# Patient Record
Sex: Female | Born: 1995 | Race: Black or African American | Hispanic: No | Marital: Single | Smoking: Current some day smoker
Health system: Southern US, Community
[De-identification: ages and names within clinical notes are randomized; demographics above are authoritative.]

## PROBLEM LIST (undated history)

## (undated) DIAGNOSIS — J020 Streptococcal pharyngitis: Secondary | ICD-10-CM

---

## 2016-07-29 ENCOUNTER — Emergency Department (HOSPITAL_COMMUNITY): Payer: Medicaid - Out of State

## 2016-07-29 ENCOUNTER — Encounter (HOSPITAL_COMMUNITY): Payer: Self-pay | Admitting: Emergency Medicine

## 2016-07-29 ENCOUNTER — Emergency Department (HOSPITAL_COMMUNITY)
Admission: EM | Admit: 2016-07-29 | Discharge: 2016-07-29 | Disposition: A | Discharge status: Home / Self Care | Payer: Medicaid - Out of State | Attending: Physician Assistant | Admitting: Physician Assistant

## 2016-07-29 DIAGNOSIS — F17200 Nicotine dependence, unspecified, uncomplicated: Secondary | ICD-10-CM | POA: Insufficient documentation

## 2016-07-29 DIAGNOSIS — J029 Acute pharyngitis, unspecified: Secondary | ICD-10-CM | POA: Insufficient documentation

## 2016-07-29 DIAGNOSIS — R05 Cough: Secondary | ICD-10-CM

## 2016-07-29 DIAGNOSIS — R059 Cough, unspecified: Secondary | ICD-10-CM

## 2016-07-29 HISTORY — DX: Streptococcal pharyngitis: J02.0

## 2016-07-29 LAB — RAPID STREP SCREEN (MED CTR MEBANE ONLY): STREPTOCOCCUS, GROUP A SCREEN (DIRECT): NEGATIVE

## 2016-07-29 NOTE — Discharge Instructions (Signed)
Your chest x-ray and strep test were both negative. As we discussed, this is likely viral. Recommend rest and drink fluids.  May use over the counter cough medication and chloraseptic to help with your throat pain. Return here for new concerns.

## 2016-07-29 NOTE — ED Triage Notes (Signed)
Pt st's she has had a sore throat and cough x's 1 week.

## 2016-07-29 NOTE — ED Provider Notes (Signed)
[facility] Provider Note   CSN: [id number] Arrival date & time: 07/29/16  1911  By signing my name below, I, Aggie Moats, attest that this documentation has been prepared under the direction and in the presence of Sharilyn Sites, PA-C. Electronically signed by: Aggie Moats, ED Scribe. 07/29/16. 8:00 PM.  History   Chief Complaint Chief Complaint  Patient presents with  . Sore Throat   The history is provided by the patient. No language interpreter was used.   HPI Comments:  Olivia Mills is a 20 y.o. female who presents to the Emergency Department complaining of cough, which started 1 week ago. Pt reports that coughing ceased a couple days ago, but resumed. Associated symptoms include sore throat which started today and low grade fever. No modifying factors reported. Denies chills or other associated symptoms. Pt requests a chest x-ray.   Past Medical History:  Diagnosis Date  . Strep throat     There are no active problems to display for this patient.   History reviewed. No pertinent surgical history.  OB History    No data available       Home Medications    Prior to Admission medications   Not on File    Family History No family history on file.  Social History Social History  Substance Use Topics  . Smoking status: Current Some Day Smoker  . Smokeless tobacco: Never Used  . Alcohol use Yes     Allergies   Review of patient's allergies indicates not on file.   Review of Systems Review of Systems  Constitutional: Positive for fever. Negative for chills.  HENT: Positive for sore throat.   Respiratory: Positive for cough.   All other systems reviewed and are negative.    Physical Exam Updated Vital Signs BP 122/83 (BP Location: Left Arm)   Pulse 94   Temp 98.9 F (37.2 C) (Oral)   Resp 20   Ht 5\' 4"  (1.626 m)   Wt 135 lb 1 oz (61.3 kg)   LMP 07/15/2016   SpO2 100%   BMI 23.18 kg/m   Physical Exam  Constitutional: She is oriented to  person, place, and time. She appears well-developed and well-nourished.  HENT:  Head: Normocephalic and atraumatic.  Right Ear: Tympanic membrane normal.  Left Ear: Tympanic membrane normal.  Nose: Nose normal.  Mouth/Throat: Uvula is midline and mucous membranes are normal. Posterior oropharyngeal erythema present.  Mild oropharyngeal erythema; tonsils overall normal in appearance bilaterally without exudate; uvula midline without evidence of peritonsillar abscess; handling secretions appropriately; no difficulty swallowing or speaking; normal phonation without stridor  Eyes: Conjunctivae and EOM are normal. Pupils are equal, round, and reactive to light.  Neck: Normal range of motion.  Cardiovascular: Normal rate, regular rhythm and normal heart sounds.   Pulmonary/Chest: Effort normal and breath sounds normal. She has no decreased breath sounds. She has no wheezes. She has no rhonchi.  Abdominal: Soft. Bowel sounds are normal.  Musculoskeletal: Normal range of motion.  Neurological: She is alert and oriented to person, place, and time.  Skin: Skin is warm and dry.  Psychiatric: She has a normal mood and affect.  Nursing note and vitals reviewed.    ED Treatments / Results  DIAGNOSTIC STUDIES:  Oxygen Saturation is 100% on room air, normal by my interpretation.    COORDINATION OF CARE:  7:57 PM Discussed treatment plan with pt at bedside, which includes chest x-ray and rapid strep screen, and pt agreed to plan.  Labs (  all labs ordered are listed, but only abnormal results are displayed) Labs Reviewed  RAPID STREP SCREEN (NOT AT [facility])  CULTURE, GROUP A STREP [facility])    EKG  EKG Interpretation None       Radiology Dg Chest 2 View  Result Date: 07/29/2016 CLINICAL DATA:  20 year old female with cough and shortness of breath. Left-sided chest pain. EXAM: CHEST  2 VIEW COMPARISON:  None. FINDINGS: The heart size and mediastinal contours are within normal limits. Both lungs  are clear. The visualized skeletal structures are unremarkable. IMPRESSION: No active cardiopulmonary disease. Electronically Signed   By: Elgie CollardArash  Radparvar M.D.   On: 07/29/2016 20:40    Procedures Procedures (including critical care time)  Medications Ordered in ED Medications - No data to display   Initial Impression / Assessment and Plan / ED Course  I have reviewed the triage vital signs and the nursing notes.  Pertinent labs & imaging results that were available during my care of the patient were reviewed by me and considered in my medical decision making (see chart for details).  Clinical Course   20 year old female here with cough and sore throat which began on Monday of this week. Patient is afebrile and nontoxic. She has mild erythema of her oropharynx without any tonsillar edema or exudates. Her lungs are clear without wheezes or rhonchi. Patient overall appears well. Feel this is likely a viral process, however she is adamant about further testing.  CXR and rapid strep are both negative.  Continue to feel this is likely viral.  Discharge home with supportive care.  Discussed plan with patient, she acknowledged understanding and agreed with plan of care.  Return precautions given for new or worsening symptoms.  Final Clinical Impressions(s) / ED Diagnoses   Final diagnoses:  Sore throat  Cough    New Prescriptions There are no discharge medications for this patient.  I personally performed the services described in this documentation, which was scribed in my presence. The recorded information has been reviewed and is accurate.    Garlon HatchetLisa M Sanders, PA-C 07/29/16 2138    Courteney Randall AnLyn Mackuen, MD 07/29/16 2328

## 2016-07-31 LAB — CULTURE, GROUP A STREP (THRC)

## 2016-09-28 ENCOUNTER — Encounter (HOSPITAL_COMMUNITY): Payer: Self-pay | Admitting: Emergency Medicine

## 2016-09-28 ENCOUNTER — Emergency Department (HOSPITAL_COMMUNITY)
Admission: RE | Admit: 2016-09-28 | Discharge: 2016-09-28 | Disposition: A | Discharge status: Home / Self Care | Payer: Medicaid - Out of State | Attending: Emergency Medicine | Admitting: Emergency Medicine

## 2016-09-28 DIAGNOSIS — B9689 Other specified bacterial agents as the cause of diseases classified elsewhere: Secondary | ICD-10-CM

## 2016-09-28 DIAGNOSIS — N760 Acute vaginitis: Secondary | ICD-10-CM | POA: Insufficient documentation

## 2016-09-28 DIAGNOSIS — F17200 Nicotine dependence, unspecified, uncomplicated: Secondary | ICD-10-CM | POA: Insufficient documentation

## 2016-09-28 DIAGNOSIS — N730 Acute parametritis and pelvic cellulitis: Secondary | ICD-10-CM | POA: Insufficient documentation

## 2016-09-28 LAB — URINALYSIS, ROUTINE W REFLEX MICROSCOPIC
Bilirubin Urine: NEGATIVE
Glucose, UA: NEGATIVE mg/dL
Hgb urine dipstick: NEGATIVE
Ketones, ur: NEGATIVE mg/dL
LEUKOCYTES UA: NEGATIVE
NITRITE: NEGATIVE
PROTEIN: NEGATIVE mg/dL
Specific Gravity, Urine: 1.011 (ref 1.005–1.030)
pH: 7 (ref 5.0–8.0)

## 2016-09-28 LAB — WET PREP, GENITAL
Sperm: NONE SEEN
TRICH WET PREP: NONE SEEN
YEAST WET PREP: NONE SEEN

## 2016-09-28 LAB — POC URINE PREG, ED: PREG TEST UR: NEGATIVE

## 2016-09-28 MED ORDER — METRONIDAZOLE 500 MG PO TABS: 500.0000 mg | ORAL_TABLET | Freq: Two times a day (BID) | ORAL | 0 refills | Status: DC

## 2016-09-28 MED ORDER — DOXYCYCLINE HYCLATE 100 MG PO CAPS: 100.0000 mg | ORAL_CAPSULE | Freq: Two times a day (BID) | ORAL | 0 refills | Status: AC

## 2016-09-28 MED ORDER — NAPROXEN 375 MG PO TABS: 375.0000 mg | ORAL_TABLET | Freq: Two times a day (BID) | ORAL | 0 refills | Status: AC | PRN

## 2016-09-28 MED ORDER — CEFTRIAXONE SODIUM 250 MG IJ SOLR
250.0000 mg | Freq: Once | INTRAMUSCULAR | Status: AC
  Administered 2016-09-28: 250 mg via INTRAMUSCULAR
  Filled 2016-09-28: qty 250

## 2016-09-28 NOTE — ED Triage Notes (Signed)
Pt states she has been having vaginal itching for 2 days. Denies any discharge. Denies any burning/frequency with urination.

## 2016-09-28 NOTE — ED Provider Notes (Signed)
[facility] Provider Note   CSN: [id number] Arrival date & time: 09/28/16  1331     History   Chief Complaint Chief Complaint  Patient presents with  . Vaginal Itching    HPI Olivia Mills is a 20 y.o. female.  HPI 20 year old female who presents with vaginal discharge and odor. The patient states that she recently started to become regularly sexually active. She has sex with males only and has used condoms for protection. She states that over the last several days, she has noticed moderate vaginal pain and burning sensation. She is also noticed increased pain with intercourse. She has had vaginal odor but only minimal white discharge over the same time period. No fevers or chills. No nausea or vomiting. She denies known exposures to STDs and states she has had protection every time that she has had intercourse. She has never had an OB visit. No history of Pap smears. No other medical problems.  Past Medical History:  Diagnosis Date  . Strep throat     There are no active problems to display for this patient.   History reviewed. No pertinent surgical history.  OB History    No data available       Home Medications    Prior to Admission medications   Medication Sig Start Date End Date Taking? Authorizing Provider  ibuprofen (ADVIL,MOTRIN) 200 MG tablet Take 200 mg by mouth every 6 (six) hours as needed for moderate pain.   Yes Historical Provider, MD  doxycycline (VIBRAMYCIN) 100 MG capsule Take 1 capsule (100 mg total) by mouth 2 (two) times daily. 09/28/16 10/12/16  Shaune Pollackameron Avrum Kimball, MD  metroNIDAZOLE (FLAGYL) 500 MG tablet Take 1 tablet (500 mg total) by mouth 2 (two) times daily. 09/28/16   Shaune Pollackameron Jermaine Tholl, MD  naproxen (NAPROSYN) 375 MG tablet Take 1 tablet (375 mg total) by mouth 2 (two) times daily as needed for moderate pain. 09/28/16 10/05/16  Shaune Pollackameron Kree Armato, MD    Family History History reviewed. No pertinent family history.  Social History Social History    Substance Use Topics  . Smoking status: Current Some Day Smoker  . Smokeless tobacco: Never Used  . Alcohol use Yes     Allergies   Patient has no known allergies.   Review of Systems Review of Systems  Constitutional: Negative for chills and fever.  Respiratory: Negative for shortness of breath.   Cardiovascular: Negative for chest pain.  Genitourinary: Positive for pelvic pain, vaginal discharge and vaginal pain.  Musculoskeletal: Negative for neck pain.  Skin: Negative for rash and wound.  Allergic/Immunologic: Negative for immunocompromised state.  Neurological: Negative for weakness and numbness.  Hematological: Does not bruise/bleed easily.     Physical Exam Updated Vital Signs BP 129/81 (BP Location: Right Arm)   Pulse 71   Temp 98.9 F (37.2 C) (Oral)   Resp 16   Ht 5\' 5"  (1.651 m)   Wt 162 lb (73.5 kg)   LMP 09/12/2016   SpO2 100%   BMI 26.96 kg/m   Physical Exam  Constitutional: She is oriented to person, place, and time. She appears well-developed and well-nourished. No distress.  HENT:  Head: Normocephalic and atraumatic.  Eyes: Conjunctivae are normal.  Neck: Neck supple.  Cardiovascular: Normal rate, regular rhythm and normal heart sounds.  Exam reveals no friction rub.   No murmur heard. Pulmonary/Chest: Effort normal and breath sounds normal. No respiratory distress. She has no wheezes. She has no rales.  Abdominal: Soft. Bowel sounds are normal.  She exhibits no distension. There is no tenderness.  Musculoskeletal: She exhibits no edema.  Neurological: She is alert and oriented to person, place, and time. She exhibits normal muscle tone.  Skin: Skin is warm. Capillary refill takes less than 2 seconds.  Psychiatric: She has a normal mood and affect.  Nursing note and vitals reviewed.    ED Treatments / Results  Labs (all labs ordered are listed, but only abnormal results are displayed) Labs Reviewed  WET PREP, GENITAL - Abnormal; Notable  for the following:       Result Value   Clue Cells Wet Prep HPF POC PRESENT (*)    WBC, Wet Prep HPF POC MANY (*)    All other components within normal limits  URINALYSIS, ROUTINE W REFLEX MICROSCOPIC (NOT AT [facility]) - Abnormal; Notable for the following:    APPearance CLOUDY (*)    All other components within normal limits  POC URINE PREG, ED  GC/CHLAMYDIA PROBE AMP ([place]) NOT AT [facility]    EKG  EKG Interpretation None       Radiology No results found.  Procedures Procedures (including critical care time)  Medications Ordered in ED Medications  cefTRIAXone (ROCEPHIN) injection 250 mg (250 mg Intramuscular Given 09/28/16 1629)     Initial Impression / Assessment and Plan / ED Course  I have reviewed the triage vital signs and the nursing notes.  Pertinent labs & imaging results that were available during my care of the patient were reviewed by me and considered in my medical decision making (see chart for details).  Clinical Course     20 year old female with no significant past medical history presents with vaginal discharge and pain. On exam, patient has friable, tender cervix with cervical motion tenderness. No adnexal tenderness. Patient is otherwise well-appearing, afebrile, and without signs of systemic infection. No RUQ TTP or signs of FHC. UPT is negative. Urinalysis shows no evidence of infection. Wet prep shows positive white blood cells and clue cells but no Trichomonas or yeast. Will treat empirically for PID as well as bacterial vaginosis. I discussed possible implications with the patient and she will follow up with student health. GC/Chlamydia sent and is pending.   Final Clinical Impressions(s) / ED Diagnoses   Final diagnoses:  BV (bacterial vaginosis)  PID (acute pelvic inflammatory disease)    New Prescriptions New Prescriptions   DOXYCYCLINE (VIBRAMYCIN) 100 MG CAPSULE    Take 1 capsule (100 mg total) by mouth 2 (two) times daily.    METRONIDAZOLE (FLAGYL) 500 MG TABLET    Take 1 tablet (500 mg total) by mouth 2 (two) times daily.   NAPROXEN (NAPROSYN) 375 MG TABLET    Take 1 tablet (375 mg total) by mouth 2 (two) times daily as needed for moderate pain.     Shaune Pollackameron Indica Marcott, MD 09/28/16 [phone]

## 2016-09-28 NOTE — ED Notes (Signed)
Pelvic cart at bedside. 

## 2016-09-29 LAB — GC/CHLAMYDIA PROBE AMP (~~LOC~~) NOT AT ARMC
CHLAMYDIA, DNA PROBE: NEGATIVE
NEISSERIA GONORRHEA: NEGATIVE

## 2016-10-05 ENCOUNTER — Emergency Department (HOSPITAL_COMMUNITY)
Admission: EM | Admit: 2016-10-05 | Discharge: 2016-10-05 | Disposition: A | Discharge status: Home / Self Care | Payer: Medicaid - Out of State | Attending: Emergency Medicine | Admitting: Emergency Medicine

## 2016-10-05 ENCOUNTER — Encounter (HOSPITAL_COMMUNITY): Payer: Self-pay | Admitting: *Deleted

## 2016-10-05 DIAGNOSIS — N898 Other specified noninflammatory disorders of vagina: Secondary | ICD-10-CM | POA: Diagnosis not present

## 2016-10-05 DIAGNOSIS — F17200 Nicotine dependence, unspecified, uncomplicated: Secondary | ICD-10-CM | POA: Diagnosis not present

## 2016-10-05 LAB — URINALYSIS, ROUTINE W REFLEX MICROSCOPIC
BILIRUBIN URINE: NEGATIVE
Glucose, UA: NEGATIVE mg/dL
Ketones, ur: NEGATIVE mg/dL
Nitrite: NEGATIVE
PH: 5.5 (ref 5.0–8.0)
Protein, ur: NEGATIVE mg/dL
SPECIFIC GRAVITY, URINE: 1.01 (ref 1.005–1.030)

## 2016-10-05 LAB — URINE MICROSCOPIC-ADD ON

## 2016-10-05 LAB — GC/CHLAMYDIA PROBE AMP (~~LOC~~) NOT AT ARMC
CHLAMYDIA, DNA PROBE: NEGATIVE
NEISSERIA GONORRHEA: NEGATIVE

## 2016-10-05 LAB — WET PREP, GENITAL
CLUE CELLS WET PREP: NONE SEEN
Sperm: NONE SEEN
TRICH WET PREP: NONE SEEN
Yeast Wet Prep HPF POC: NONE SEEN

## 2016-10-05 MED ORDER — FLUCONAZOLE 100 MG PO TABS
150.0000 mg | ORAL_TABLET | Freq: Once | ORAL | Status: AC
  Administered 2016-10-05: 150 mg via ORAL
  Filled 2016-10-05: qty 2

## 2016-10-05 NOTE — ED Triage Notes (Signed)
Pt reports being seen on 11/8 and diagnosed with BV. Has been taking antibiotics but reports increase in vaginal discharge, burning and itching.

## 2016-10-05 NOTE — ED Provider Notes (Signed)
[facility] Provider Note   CSN: [id number] Arrival date & time: 10/05/16  1145     History   Chief Complaint Chief Complaint  Patient presents with  . Vaginal Discharge    HPI Olivia Mills is a 20 y.o. female.  The history is provided by the patient.  Vaginal Discharge   This is a new problem. The current episode started more than 2 days ago. The problem occurs constantly. The problem has not changed since onset.The discharge occurs while at rest. The discharge was brown. She is not pregnant. Missed Period: currently on her period. Pertinent negatives include no nausea and no vomiting. Treatments tried: rocephin/doxy/flagyl. The treatment provided no relief. Her past medical history does not include STD.    Past Medical History:  Diagnosis Date  . Strep throat     There are no active problems to display for this patient.   History reviewed. No pertinent surgical history.  OB History    No data available       Home Medications    Prior to Admission medications   Medication Sig Start Date End Date Taking? Authorizing Provider  doxycycline (VIBRAMYCIN) 100 MG capsule Take 1 capsule (100 mg total) by mouth 2 (two) times daily. 09/28/16 10/12/16  Shaune Pollackameron Isaacs, MD  ibuprofen (ADVIL,MOTRIN) 200 MG tablet Take 200 mg by mouth every 6 (six) hours as needed for moderate pain.    Historical Provider, MD  metroNIDAZOLE (FLAGYL) 500 MG tablet Take 1 tablet (500 mg total) by mouth 2 (two) times daily. 09/28/16   Shaune Pollackameron Isaacs, MD  naproxen (NAPROSYN) 375 MG tablet Take 1 tablet (375 mg total) by mouth 2 (two) times daily as needed for moderate pain. 09/28/16 10/05/16  Shaune Pollackameron Isaacs, MD    Family History History reviewed. No pertinent family history.  Social History Social History  Substance Use Topics  . Smoking status: Current Some Day Smoker  . Smokeless tobacco: Never Used  . Alcohol use Yes     Allergies   Patient has no known allergies.   Review of  Systems Review of Systems  Gastrointestinal: Negative for nausea and vomiting.  Genitourinary: Positive for vaginal discharge.  All other systems reviewed and are negative.    Physical Exam Updated Vital Signs BP 134/86   Pulse 84   Temp 98.2 F (36.8 C) (Oral)   Resp 17   LMP 09/12/2016   SpO2 100%   Physical Exam  Constitutional: She is oriented to person, place, and time. She appears well-developed and well-nourished. No distress.  HENT:  Head: Normocephalic.  Nose: Nose normal.  Eyes: Conjunctivae are normal.  Neck: Neck supple. No tracheal deviation present.  Cardiovascular: Normal rate and regular rhythm.   Pulmonary/Chest: Effort normal. No respiratory distress.  Abdominal: Soft. She exhibits no distension.  Genitourinary: Uterus normal. Pelvic exam was performed with patient supine. There is no rash on the right labia. There is no rash on the left labia. Cervix exhibits no motion tenderness and no discharge. Right adnexum displays no tenderness and no fullness. Left adnexum displays no tenderness and no fullness. There is bleeding in the vagina.  Neurological: She is alert and oriented to person, place, and time.  Skin: Skin is warm and dry.  Psychiatric: She has a normal mood and affect.  Vitals reviewed.    ED Treatments / Results  Labs (all labs ordered are listed, but only abnormal results are displayed) Labs Reviewed  WET PREP, GENITAL - Abnormal; Notable for the following:  Result Value   WBC, Wet Prep HPF POC MODERATE (*)    All other components within normal limits  URINALYSIS, ROUTINE W REFLEX MICROSCOPIC (NOT AT [facility]) - Abnormal; Notable for the following:    Color, Urine AMBER (*)    APPearance HAZY (*)    Hgb urine dipstick LARGE (*)    Leukocytes, UA SMALL (*)    All other components within normal limits  URINE MICROSCOPIC-ADD ON - Abnormal; Notable for the following:    Squamous Epithelial / LPF 6-30 (*)    Bacteria, UA FEW (*)    All  other components within normal limits  GC/CHLAMYDIA PROBE AMP ([place]) NOT AT [facility]    EKG  EKG Interpretation None       Radiology No results found.  Procedures Procedures (including critical care time)  Medications Ordered in ED Medications - No data to display   Initial Impression / Assessment and Plan / ED Course  I have reviewed the triage vital signs and the nursing notes.  Pertinent labs & imaging results that were available during my care of the patient were reviewed by me and considered in my medical decision making (see chart for details).  Clinical Course     20 y.o. female presents with Vaginal irritation and itching that started 1 week ago. She was seen here and diagnosed with bacterial vaginosis and has been on a course of Flagyl and doxycycline to cover for BV and PID. Her GC chlamydia testing was negative at that time. She is currently on her cycle and feels that the itching has not improved. She was not covered for yeast on the previous visit so she will be given 150 mg of fluconazole for empiric coverage. I recommended that she continue her antibiotics and follow up with OB/GYN on an outpatient basis for further complaints as she does not appear to have an emergent medical condition currently.  Final Clinical Impressions(s) / ED Diagnoses   Final diagnoses:  Vaginal itching    New Prescriptions New Prescriptions   No medications on file     Lyndal Pulleyaniel Daiana Vitiello, MD 10/05/16 2219

## 2017-03-10 ENCOUNTER — Encounter (HOSPITAL_COMMUNITY): Payer: Self-pay | Admitting: *Deleted

## 2017-03-10 ENCOUNTER — Emergency Department (HOSPITAL_COMMUNITY)
Admission: EM | Admit: 2017-03-10 | Discharge: 2017-03-10 | Disposition: A | Discharge status: Home / Self Care | Payer: Medicaid - Out of State | Attending: Emergency Medicine | Admitting: Emergency Medicine

## 2017-03-10 DIAGNOSIS — N760 Acute vaginitis: Secondary | ICD-10-CM | POA: Insufficient documentation

## 2017-03-10 DIAGNOSIS — F17200 Nicotine dependence, unspecified, uncomplicated: Secondary | ICD-10-CM | POA: Insufficient documentation

## 2017-03-10 DIAGNOSIS — B9689 Other specified bacterial agents as the cause of diseases classified elsewhere: Secondary | ICD-10-CM

## 2017-03-10 DIAGNOSIS — Z79899 Other long term (current) drug therapy: Secondary | ICD-10-CM | POA: Diagnosis not present

## 2017-03-10 DIAGNOSIS — N898 Other specified noninflammatory disorders of vagina: Secondary | ICD-10-CM | POA: Diagnosis present

## 2017-03-10 LAB — WET PREP, GENITAL
Sperm: NONE SEEN
TRICH WET PREP: NONE SEEN
YEAST WET PREP: NONE SEEN

## 2017-03-10 MED ORDER — METRONIDAZOLE 500 MG PO TABS: 500.0000 mg | ORAL_TABLET | Freq: Two times a day (BID) | ORAL | 0 refills | Status: DC

## 2017-03-10 NOTE — ED Provider Notes (Signed)
[facility] Provider Note   CSN: [id number] Arrival date & time: 03/10/17  4098  By signing my name below, I, Olivia Mills, attest that this documentation has been prepared under the direction and in the presence of  Olivia McDonald, PA-C. Electronically Signed: Doreatha Mills, ED Scribe. 03/10/17. 8:24 PM.    History   Chief Complaint Chief Complaint  Patient presents with  . Vaginal Itching    HPI Olivia Mills is a 21 y.o. 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 No worsening or alleviating factors noted. She states her current symptoms are similar to prior h/o BV. She states she has these symptoms occur with most menstrual cycles. She is currently sexually active in a monogamous relationship. Last Flagyl use was 2 months ago. She denies vaginal bleeding, dysuria, vaginal pain, constipation, diarrhea, CP, SOB.    The history is provided by the patient. No language interpreter was used.    Past Medical History:  Diagnosis Date  . Strep throat     There are no active problems to display for this patient.   History reviewed. No pertinent surgical history.  OB History    No data available       Home Medications    Prior to Admission medications   Medication Sig Start Date End Date Taking? Authorizing Provider  ibuprofen (ADVIL,MOTRIN) 200 MG tablet Take 200 mg by mouth every 6 (six) hours as needed for moderate pain.    Historical Provider, MD  metroNIDAZOLE (FLAGYL) 500 MG tablet Take 1 tablet (500 mg total) by mouth 2 (two) times daily. 03/10/17   Olivia A McDonald, PA-C    Family History No family history on file.  Social History Social History  Substance Use Topics  . Smoking status: Current Some Day Smoker  . Smokeless tobacco: Never Used  . Alcohol use Yes     Allergies   Patient has no known allergies.   Review of  Systems Review of Systems  Constitutional: Negative for activity change.  Respiratory: Negative for shortness of breath.   Cardiovascular: Negative for chest pain.  Gastrointestinal: Negative for abdominal pain.  Genitourinary: Positive for vaginal discharge. Negative for dysuria, flank pain, frequency, urgency, vaginal bleeding and vaginal pain.       +vaginal itching  Musculoskeletal: Negative for back pain.  Skin: Negative for rash and wound.  Allergic/Immunologic: Negative for immunocompromised state.  Psychiatric/Behavioral: Negative for confusion.    Physical Exam Updated Vital Signs BP 120/74 (BP Location: Left Arm)   Pulse 72   Temp 98.4 F (36.9 C) (Oral)   Resp 16   LMP 03/06/2017   SpO2 99%   Physical Exam  Constitutional: She appears well-developed and well-nourished. No distress.  HENT:  Head: Normocephalic and atraumatic.  Eyes: Conjunctivae are normal.  Neck: Neck supple.  Cardiovascular: Normal rate and regular rhythm.  Exam reveals no gallop and no friction rub.   No murmur heard. Pulmonary/Chest: Effort normal and breath sounds normal. No respiratory distress. She has no wheezes. She has no rales.  Abdominal: Soft. She exhibits no distension. There is no tenderness. There is no guarding.  Genitourinary: Uterus normal. Pelvic exam was performed with patient supine. No labial fusion. There is no rash, tenderness, lesion or injury on the right labia. There is no rash, tenderness, lesion or injury on the left labia. Cervix exhibits no motion tenderness. Right adnexum displays no  mass, no tenderness and no fullness. Left adnexum displays no mass, no tenderness and no fullness. There is tenderness (mild) in the vagina. No erythema or bleeding in the vagina. Vaginal discharge found.  Genitourinary Comments: Chaperone present throughout entire exam.    Musculoskeletal: She exhibits no edema.  Neurological: She is alert.  Skin: Skin is warm and dry. No rash noted. She  is not diaphoretic.  Psychiatric: Her behavior is normal.  Nursing note and vitals reviewed.    ED Treatments / Results   DIAGNOSTIC STUDIES: Oxygen Saturation is 100% on RA, normal by my interpretation.    COORDINATION OF CARE: 8:22 PM Discussed treatment plan with pt at bedside which includes pelvic exam and pt agreed to plan.    Labs (all labs ordered are listed, but only abnormal results are displayed) Labs Reviewed  WET PREP, GENITAL - Abnormal; Notable for the following:       Result Value   Clue Cells Wet Prep HPF POC PRESENT (*)    WBC, Wet Prep HPF POC MANY (*)    All other components within normal limits    EKG  EKG Interpretation None       Radiology No results found.  Procedures Procedures (including critical care time)  Medications Ordered in ED Medications - No data to display   Initial Impression / Assessment and Plan / ED Course  I have reviewed the triage vital signs and the nursing notes.  Pertinent labs & imaging results that were available during my care of the patient were reviewed by me and considered in my medical decision making (see chart for details).     Patient to be discharged with instructions to follow up with OBGYN. Wet prep with increased WBCs and clue cells. Pt not concerning for PID because hemodynamically stable and no cervical motion tenderness on pelvic exam.Gc/chlamydia testing deferred at this time due to patient's sexual history and exam.  Pt will be discharged with flagyl for Bacterial Vaginosis. Pt has been advised to not drink alcohol while on this medication.   Final Clinical Impressions(s) / ED Diagnoses   Final diagnoses:  BV (bacterial vaginosis)    New Prescriptions Discharge Medication List as of 03/10/2017 10:27 PM     I personally performed the services described in this documentation, which was scribed in my presence. The recorded information has been reviewed and is accurate.    Barkley Boards,  PA-C 03/12/17 1523    Mancel Bale, MD 03/13/17 [phone]

## 2017-03-10 NOTE — ED Triage Notes (Signed)
p tstates that she is here because she does not have a primary care here yet and she has recurrent BV. Pt requesting to not have pelvic exam.

## 2017-03-10 NOTE — Discharge Instructions (Signed)
Please return to the Emergency Department for new or worsening symptoms. Please call [facility] to get established with an OBGYN. Please do not drink any alcohol while taking metronidazole because it can cause profuse vomiting.

## 2017-03-24 ENCOUNTER — Encounter (HOSPITAL_COMMUNITY): Payer: Self-pay

## 2017-03-24 DIAGNOSIS — F17200 Nicotine dependence, unspecified, uncomplicated: Secondary | ICD-10-CM | POA: Diagnosis not present

## 2017-03-24 DIAGNOSIS — N898 Other specified noninflammatory disorders of vagina: Secondary | ICD-10-CM | POA: Insufficient documentation

## 2017-03-24 NOTE — ED Triage Notes (Signed)
Pt reports she was diagnosed with BV last week and has been taking Flagyl but states she thinks it has given her a yeast infection and reports thick white non-odorous vaginal discharge.

## 2017-03-25 ENCOUNTER — Emergency Department (HOSPITAL_COMMUNITY)
Admission: EM | Admit: 2017-03-25 | Discharge: 2017-03-25 | Disposition: A | Discharge status: Home / Self Care | Payer: Medicaid - Out of State | Attending: Emergency Medicine | Admitting: Emergency Medicine

## 2017-03-25 DIAGNOSIS — B373 Candidiasis of vulva and vagina: Secondary | ICD-10-CM

## 2017-03-25 DIAGNOSIS — B3731 Acute candidiasis of vulva and vagina: Secondary | ICD-10-CM

## 2017-03-25 LAB — POC URINE PREG, ED: PREG TEST UR: NEGATIVE

## 2017-03-25 LAB — URINALYSIS, ROUTINE W REFLEX MICROSCOPIC
Bilirubin Urine: NEGATIVE
Glucose, UA: 150 mg/dL — AB
HGB URINE DIPSTICK: NEGATIVE
Ketones, ur: 5 mg/dL — AB
NITRITE: NEGATIVE
Protein, ur: NEGATIVE mg/dL
Specific Gravity, Urine: 1.033 — ABNORMAL HIGH (ref 1.005–1.030)
pH: 5 (ref 5.0–8.0)

## 2017-03-25 LAB — WET PREP, GENITAL
Clue Cells Wet Prep HPF POC: NONE SEEN
Sperm: NONE SEEN
Trich, Wet Prep: NONE SEEN

## 2017-03-25 MED ORDER — FLUCONAZOLE 150 MG PO TABS: 150.0000 mg | ORAL_TABLET | Freq: Once | ORAL | 0 refills | Status: AC

## 2017-03-25 MED ORDER — FLUCONAZOLE 100 MG PO TABS
100.0000 mg | ORAL_TABLET | Freq: Once | ORAL | Status: AC
  Administered 2017-03-25: 100 mg via ORAL
  Filled 2017-03-25: qty 1

## 2017-03-25 NOTE — Discharge Instructions (Signed)
To find a primary care or specialty doctor please call [phone] or [phone] to access "[facility] Find a Doctor Service." ° °You may also go on the [facility] website at [email or web address] ° °There are also multiple [facility] practices throughout the [place] that are frequently accepting new patients. You may find a clinic that is close to your home and contact them. ° °[facility] -  °[address] °[place] [address] °[phone] ° ° °[facility] -  °[address] °[place] [address] [phone] ° ° °[facility] - °[address]  °[place] [address] °[phone] ° ° ° ° ° °[facility] °[email or web address] °[address] °[place], [place] °[phone]  ° ° °[facility] °[email or web address] °[address] °[place], [place] °[phone]  ° ° °[facility] °[address] °[place], [place] °[phone]  ° °[facility] °[email or web address] °[address] °[place], [place] °[phone]  ° °[facility] °[email or web address] °[address] °[place], [place] °[phone]  ° °[facility] °[email or web address] °[address] °[place],  °[phone] ° °

## 2017-03-25 NOTE — ED Notes (Signed)
Patient denies pain and is resting comfortably.  

## 2017-03-25 NOTE — ED Provider Notes (Signed)
TIME SEEN: 4:22 AM  CHIEF COMPLAINT: Vaginal discharge  HPI: Patient is a 21 year old female who is sexually active with one female partner who presents emergency department with thick white vaginal discharge, itching and hurting hasn't for the past few days. No odor. No abnormal vaginal bleeding. Last menstrual period April 16. No history of pregnancies or sexually transmitted diseases. No abdominal pain, nausea, vomiting, diarrhea, fever, dysuria or hematuria. Patient was seen in the emergency department on 03/10/17 and had a wet prep that was positive for BV. Was started on Flagyl. Reports Flagyl often causes her to have a yeast infection. At that time she did not have a gonorrhea and Chlamydia culture sent.  ROS: See HPI Constitutional: no fever  Eyes: no drainage  ENT: no runny nose   Cardiovascular:  no chest pain  Resp: no SOB  GI: no vomiting GU: no dysuria Integumentary: no rash  Allergy: no hives  Musculoskeletal: no leg swelling  Neurological: no slurred speech ROS otherwise negative  PAST MEDICAL HISTORY/PAST SURGICAL HISTORY:  Past Medical History:  Diagnosis Date  . Strep throat     MEDICATIONS:  Prior to Admission medications   Medication Sig Start Date End Date Taking? Authorizing Provider  ibuprofen (ADVIL,MOTRIN) 200 MG tablet Take 200 mg by mouth every 6 (six) hours as needed for moderate pain.    [provider]  metroNIDAZOLE (FLAGYL) 500 MG tablet Take 1 tablet (500 mg total) by mouth 2 (two) times daily. 03/10/17   McDonald, Mia A, PA-C    ALLERGIES:  No Known Allergies  SOCIAL HISTORY:  Social History  Substance Use Topics  . Smoking status: Current Some Day Smoker  . Smokeless tobacco: Never Used  . Alcohol use Yes    FAMILY HISTORY: No family history on file.  EXAM: BP 121/69   Pulse 96   Temp 98.8 F (37.1 C)   Resp (!) 22   LMP 03/06/2017   SpO2 100%  CONSTITUTIONAL: Alert and oriented and responds appropriately to questions.  Well-appearing; well-nourished HEAD: Normocephalic EYES: Conjunctivae clear, pupils appear equal, EOMI ENT: normal nose; moist mucous membranes NECK: Supple, no meningismus, no nuchal rigidity, no LAD  CARD: RRR; S1 and S2 appreciated; no murmurs, no clicks, no rubs, no gallops RESP: Normal chest excursion without splinting or tachypnea; breath sounds clear and equal bilaterally; no wheezes, no rhonchi, no rales, no hypoxia or respiratory distress, speaking full sentences ABD/GI: Normal bowel sounds; non-distended; soft, non-tender, no rebound, no guarding, no peritoneal signs, no hepatosplenomegaly GU:  Normal external genitalia. No lesions, rashes noted. Patient has no vaginal bleeding on exam. Thick white vaginal discharge.  No adnexal tenderness, mass or fullness, no cervical motion tenderness. Cervix is not appear friable.  Cervix is closed.  Chaperone present for exam. BACK:  The back appears normal and is non-tender to palpation, there is no CVA tenderness EXT: Normal ROM in all joints; non-tender to palpation; no edema; normal capillary refill; no cyanosis, no calf tenderness or swelling    SKIN: Normal color for age and race; warm; no rash NEURO: Moves all extremities equally PSYCH: The patient's mood and manner are appropriate. Grooming and personal hygiene are appropriate.  MEDICAL DECISION MAKING: Patient here with what appears to be a yeast infection. Wet prep, GC and Chlamydia sent. No abdominal tenderness, cervical motion tenderness, adnexal tenderness or fullness on exam. Doubt PID, TOA, torsion. Urinalysis, urine pregnancy pending. We'll give Diflucan.  ED PROGRESS: 5:50 AM  Pt's wet prep is positive for  use. Given Diflucan here in the emergency department. Urine otherwise unremarkable. Pregnancy test negative. I feel she is safe to be discharged home. We'll give outpatient PCP as well as OB/GYN follow-up.   At this time, I do not feel there is any life-threatening condition  present. I have reviewed and discussed all results (EKG, imaging, lab, urine as appropriate) and exam findings with patient/family. I have reviewed nursing notes and appropriate previous records.  I feel the patient is safe to be discharged home without further emergent workup and can continue workup as an outpatient as needed. Discussed usual and customary return precautions. Patient/family verbalize understanding and are comfortable with this plan.  Outpatient follow-up has been provided if needed. All questions have been answered.      Tynetta Bachmann, Layla MawKristen N, DO 03/25/17 [phone]

## 2017-03-27 LAB — GC/CHLAMYDIA PROBE AMP (~~LOC~~) NOT AT ARMC
CHLAMYDIA, DNA PROBE: NEGATIVE
Neisseria Gonorrhea: NEGATIVE

## 2017-06-16 IMAGING — DX DG CHEST 2V
2 series · 2 of 2 positions shown · non-contrast
Comparison: None.

CLINICAL DATA: 20-year-old female with cough and shortness of
breath. Left-sided chest pain.

EXAM:
CHEST  2 VIEW

[chest pa]
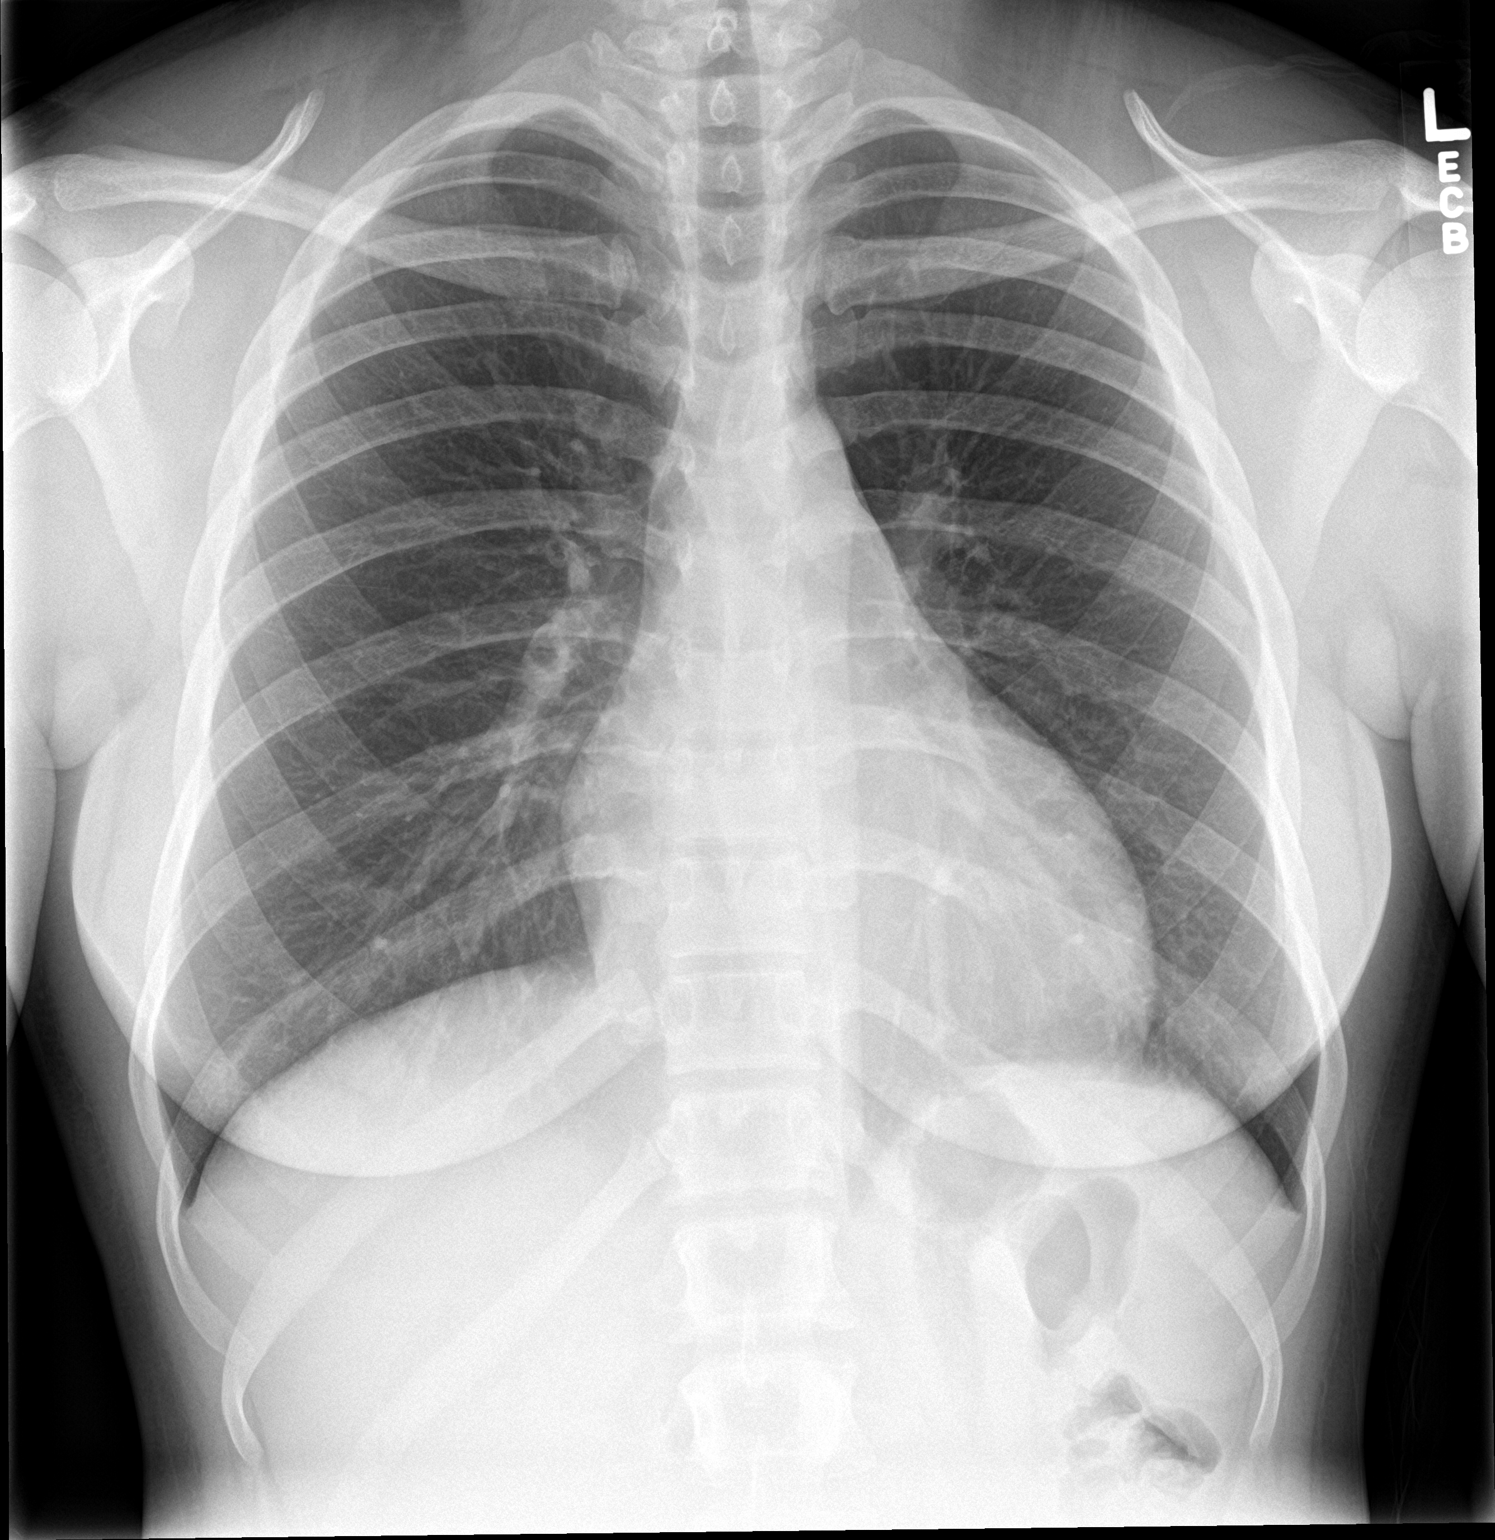

[chest lat]
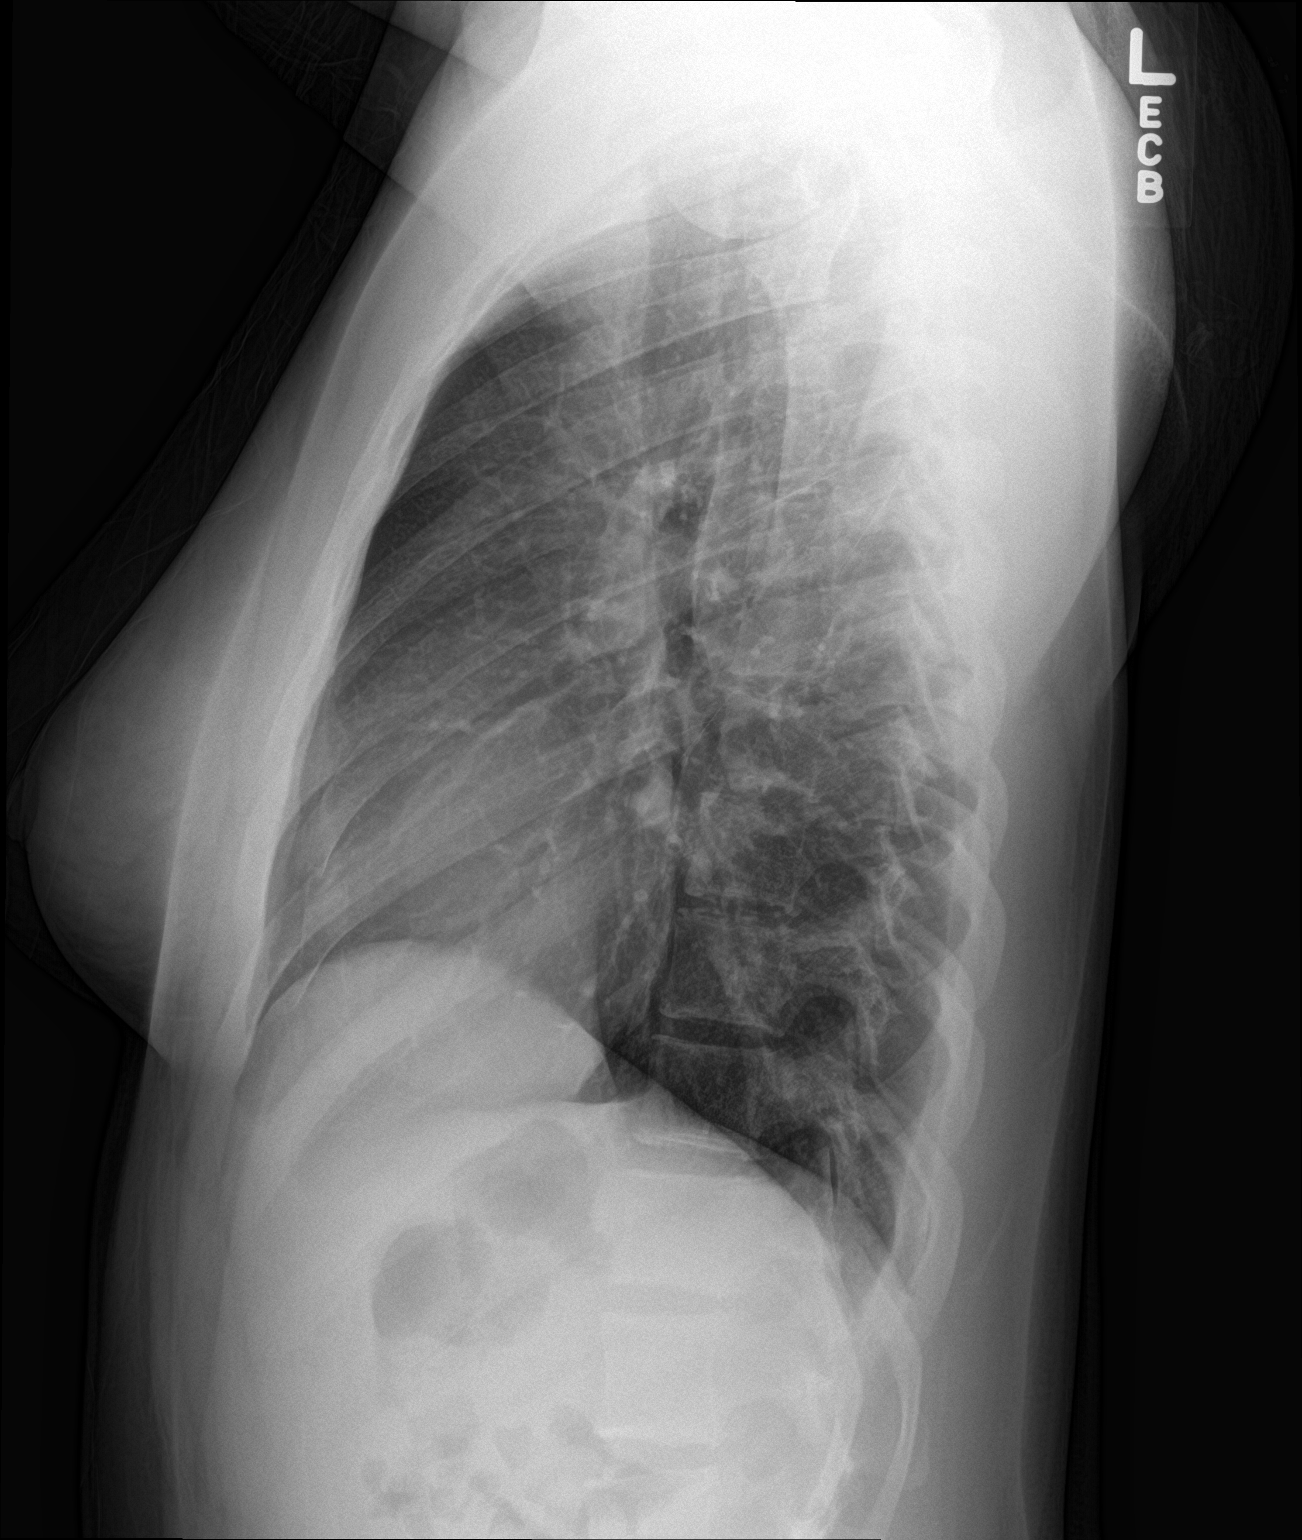

[2 of 2 positions shown; findings below may reference images not displayed]

FINDINGS: The heart size and mediastinal contours are within normal limits.
Both lungs are clear. The visualized skeletal structures are
unremarkable.
IMPRESSION: No active cardiopulmonary disease.
